# Patient Record
Sex: Male | Born: 1945 | Race: Black or African American | Hispanic: No | Marital: Single
Health system: Southern US, Community
[De-identification: ages and names within clinical notes are randomized; demographics above are authoritative.]

---

## 2004-05-28 ENCOUNTER — Inpatient Hospital Stay: Payer: Self-pay | Admitting: Unknown Physician Specialty

## 2004-07-05 ENCOUNTER — Inpatient Hospital Stay: Payer: Self-pay | Admitting: Unknown Physician Specialty

## 2004-08-24 ENCOUNTER — Ambulatory Visit: Payer: Self-pay | Admitting: Orthopedic Surgery

## 2004-10-05 ENCOUNTER — Ambulatory Visit: Payer: Self-pay | Admitting: Orthopedic Surgery

## 2005-12-18 IMAGING — CT CT HEAD WITHOUT CONTRAST
2 series · 16 of 30 positions shown, 20 images · non-contrast
Comparison: none

REASON FOR EXAM: grand mal seizure - no previous history
COMMENTS:  LMP: (Male)

[Series 2: without · axial · non-contrast · 0.44mm/px · z∈[+477,+587]mm · 13 of 26 slices shown, 17 images]
[im 2/26  brain]
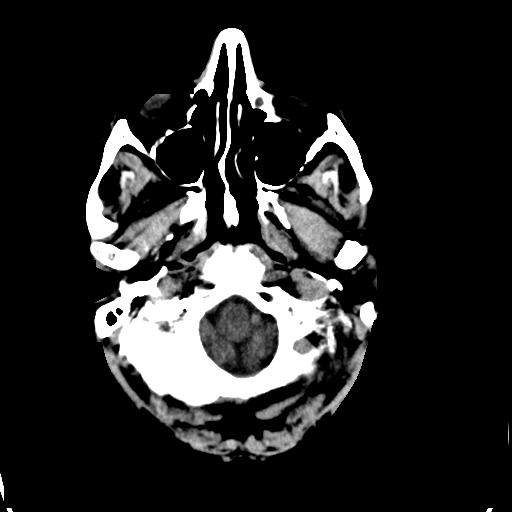
[im 2/26  bone]
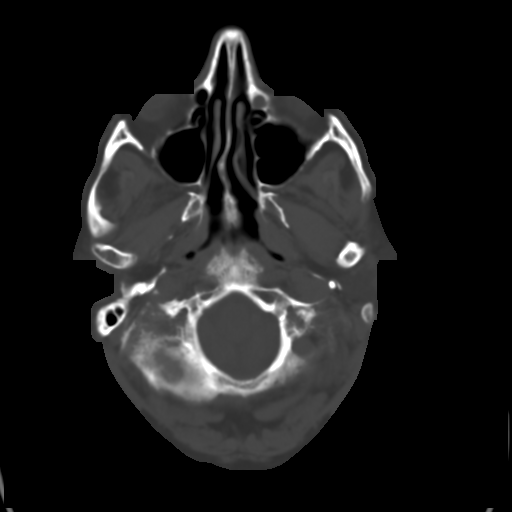
[im 4/26  brain]
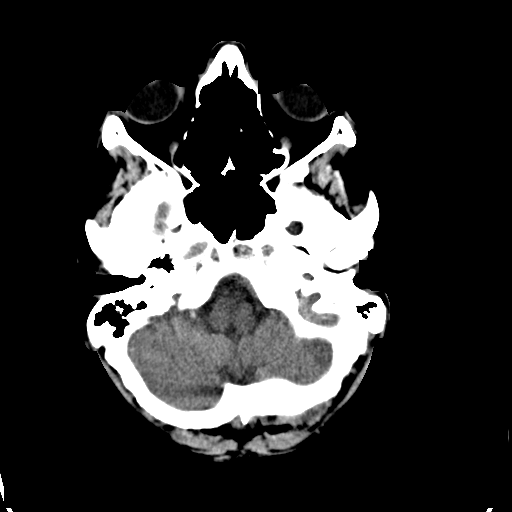
[im 6/26  brain]
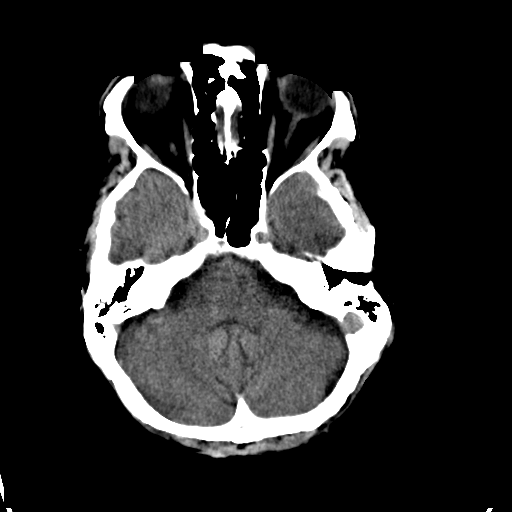
[im 8/26  brain]
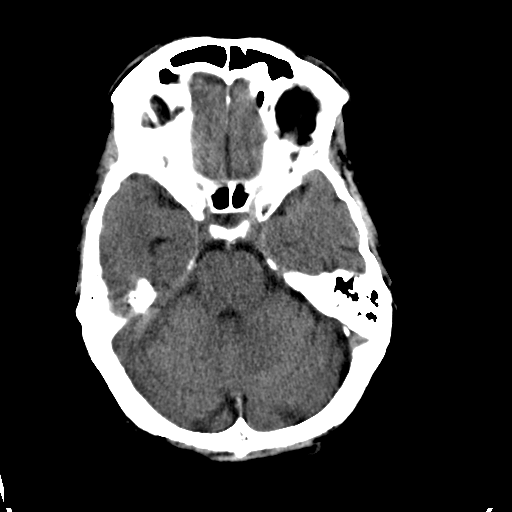
[im 9/26  brain]
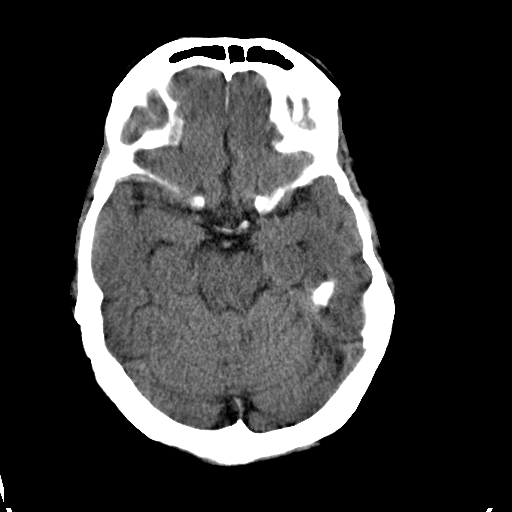
[im 9/26  bone]
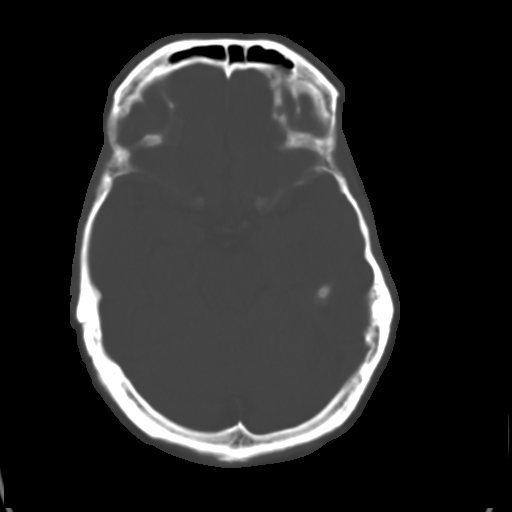
[im 11/26  brain]
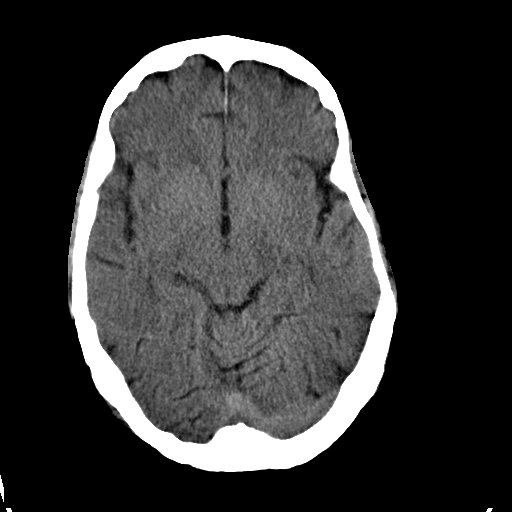
[im 13/26  brain]
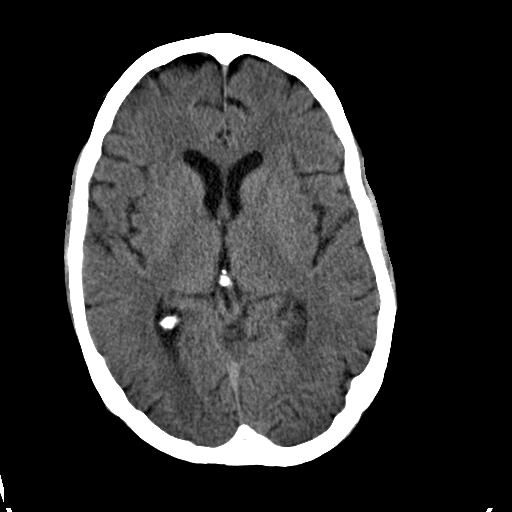
[im 15/26  brain]
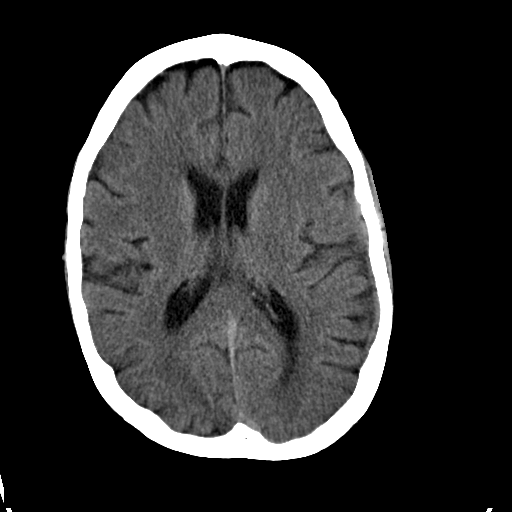
[im 17/26  brain]
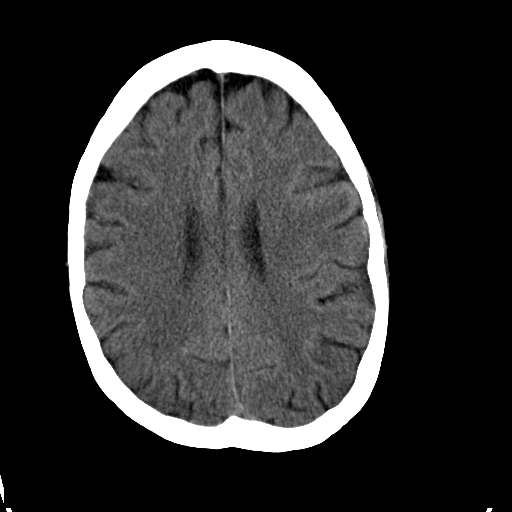
[im 17/26  bone]
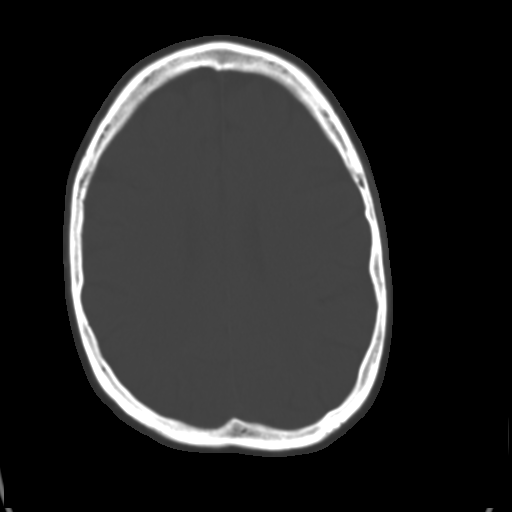
[im 18/26  brain]
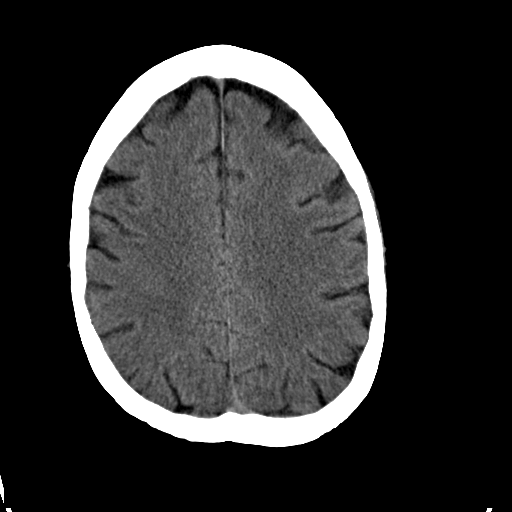
[im 20/26  brain]
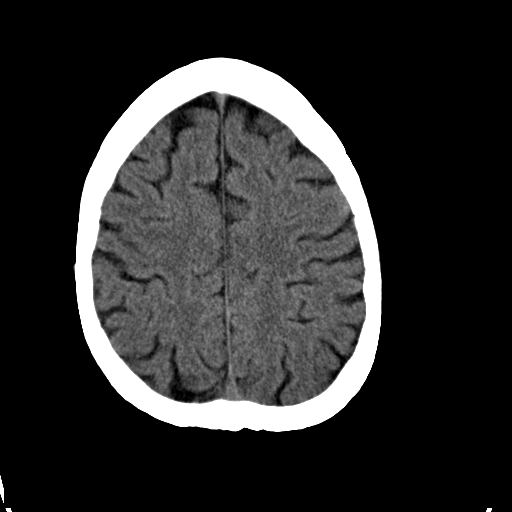
[im 22/26  brain]
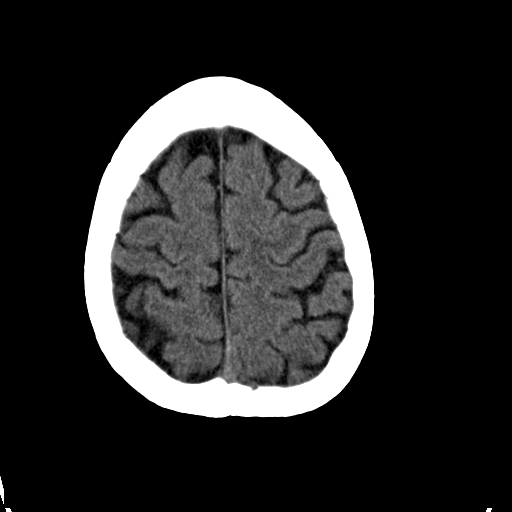
[im 24/26  brain]
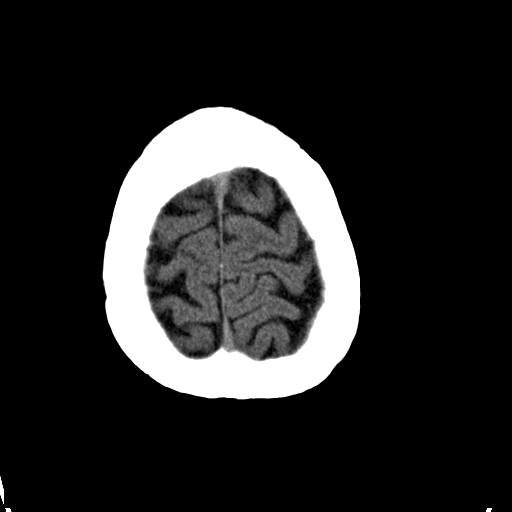
[im 24/26  bone]
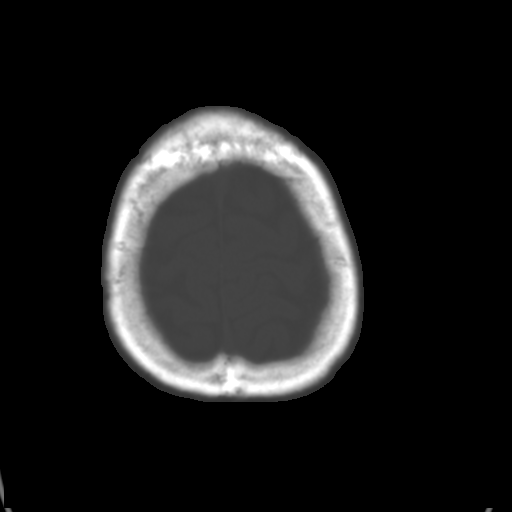

[Series 3: bone windows · axial · 0.44mm/px · z∈[+477,+512]mm · 3 of 26 slices shown]
[im 2/26  bone]
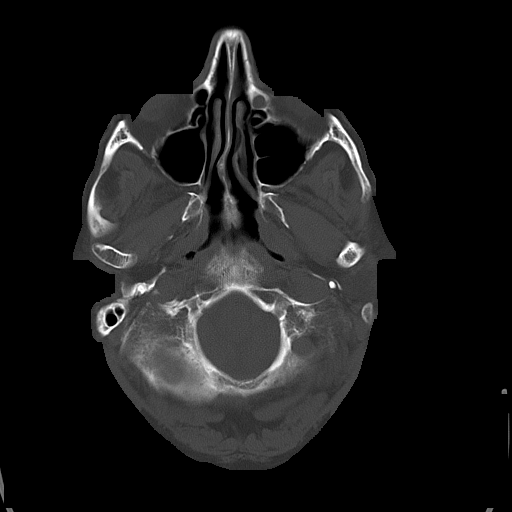
[im 6/26  bone]
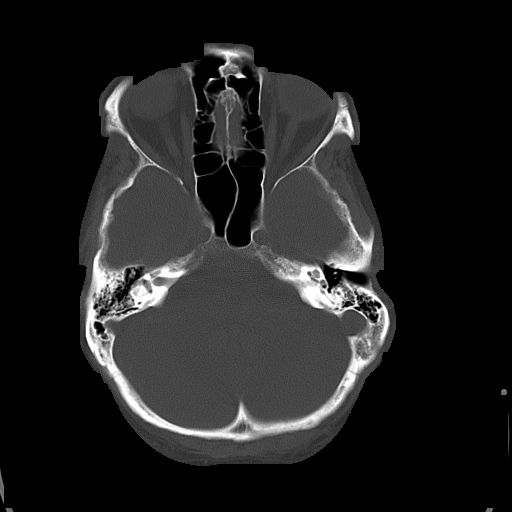
[im 9/26  bone]
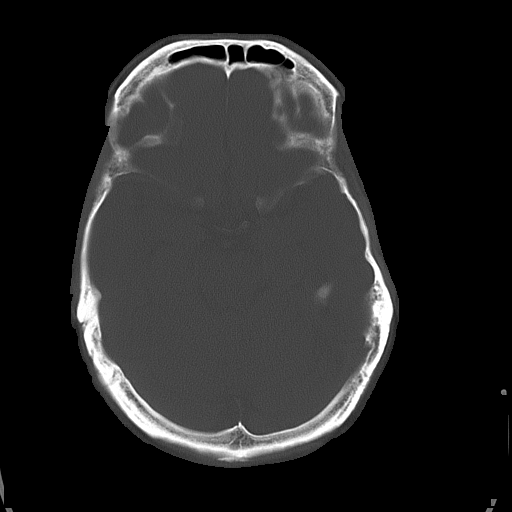

[16 of 30 positions shown; findings below may reference images not displayed]

PROCEDURE:     CT  - CT HEAD WITHOUT CONTRAST  - June 04, 2004  [DATE]

RESULT:     The ventricles are normal in size and position. I see no
evidence of a mass nor of mass effect.  No abnormal intra nor extra-axial
fluid collections are seen. There is no evidence of an intracranial
hemorrhage. The cerebellum and brainstem exhibit normal density.  At bone
window settings I see no air fluid levels in the visualized portions of the
paranasal sinuses.  No abnormality of the calvarium is seen.
IMPRESSION: 1)I see no acute intracranial abnormality.  I see no finding to explain the
patient's new onset seizures.  Follow-up contrast enhanced CT scanning or
MRI may be of value.

## 2013-05-07 DEATH — deceased

## 2013-05-22 ENCOUNTER — Telehealth: Payer: Self-pay

## 2013-05-22 NOTE — Telephone Encounter (Signed)
Patient past away @ Home per [facility] [place] °
